# Patient Record
Sex: Female | Born: 2005 | Race: Black or African American | Hispanic: No | Marital: Single | Smoking: Never smoker
Health system: Southern US, Community
[De-identification: ages and names within clinical notes are randomized; demographics above are authoritative.]

## PROBLEM LIST (undated history)

## (undated) DIAGNOSIS — Z789 Other specified health status: Secondary | ICD-10-CM

## (undated) HISTORY — DX: Other specified health status: Z78.9

---
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
## 2024-03-22 NOTE — Telephone Encounter (Signed)
 Documents have been faxed to [facility] at [facility].

## 2024-03-26 NOTE — Telephone Encounter (Signed)
 Rx for zofran  has been sent.

## 2024-04-04 ENCOUNTER — Emergency Department (HOSPITAL_COMMUNITY)
Admission: EM | Admit: 2024-04-04 | Discharge: 2024-04-04 | Disposition: A | Discharge status: Home / Self Care | Attending: Emergency Medicine | Admitting: Emergency Medicine

## 2024-04-04 ENCOUNTER — Other Ambulatory Visit: Payer: Self-pay

## 2024-04-04 ENCOUNTER — Encounter (HOSPITAL_COMMUNITY): Payer: Self-pay | Admitting: Emergency Medicine

## 2024-04-04 ENCOUNTER — Emergency Department (HOSPITAL_COMMUNITY)

## 2024-04-04 DIAGNOSIS — Y92410 Unspecified street and highway as the place of occurrence of the external cause: Secondary | ICD-10-CM | POA: Insufficient documentation

## 2024-04-04 DIAGNOSIS — O26891 Other specified pregnancy related conditions, first trimester: Secondary | ICD-10-CM | POA: Insufficient documentation

## 2024-04-04 DIAGNOSIS — R109 Unspecified abdominal pain: Secondary | ICD-10-CM | POA: Diagnosis not present

## 2024-04-04 DIAGNOSIS — Z3A01 Less than 8 weeks gestation of pregnancy: Secondary | ICD-10-CM | POA: Diagnosis not present

## 2024-04-04 DIAGNOSIS — M549 Dorsalgia, unspecified: Secondary | ICD-10-CM | POA: Insufficient documentation

## 2024-04-04 LAB — CBC WITH DIFFERENTIAL/PLATELET
Abs Immature Granulocytes: 0.03 K/uL (ref 0.00–0.07)
Basophils Absolute: 0.1 K/uL (ref 0.0–0.1)
Basophils Relative: 1 %
Eosinophils Absolute: 0.2 K/uL (ref 0.0–0.5)
Eosinophils Relative: 2 %
HCT: 39.7 % (ref 36.0–46.0)
Hemoglobin: 12.6 g/dL (ref 12.0–15.0)
Immature Granulocytes: 0 %
Lymphocytes Relative: 26 %
Lymphs Abs: 3.1 K/uL (ref 0.7–4.0)
MCH: 28.1 pg (ref 26.0–34.0)
MCHC: 31.7 g/dL (ref 30.0–36.0)
MCV: 88.4 fL (ref 80.0–100.0)
Monocytes Absolute: 0.6 K/uL (ref 0.1–1.0)
Monocytes Relative: 5 %
Neutro Abs: 7.7 K/uL (ref 1.7–7.7)
Neutrophils Relative %: 66 %
Platelets: 406 K/uL — ABNORMAL HIGH (ref 150–400)
RBC: 4.49 MIL/uL (ref 3.87–5.11)
RDW: 13.6 % (ref 11.5–15.5)
WBC: 11.7 K/uL — ABNORMAL HIGH (ref 4.0–10.5)
nRBC: 0 % (ref 0.0–0.2)

## 2024-04-04 LAB — COMPREHENSIVE METABOLIC PANEL WITH GFR
ALT: <5 U/L (ref 0–44)
AST: 21 U/L (ref 15–41)
Albumin: 4.1 g/dL (ref 3.5–5.0)
Alkaline Phosphatase: 67 U/L (ref 38–126)
Anion gap: 10 (ref 5–15)
BUN: 9 mg/dL (ref 6–20)
CO2: 22 mmol/L (ref 22–32)
Calcium: 9.4 mg/dL (ref 8.9–10.3)
Chloride: 102 mmol/L (ref 98–111)
Creatinine, Ser: 0.62 mg/dL (ref 0.44–1.00)
GFR, Estimated: >60 mL/min (ref >60)
Glucose, Bld: 101 mg/dL — ABNORMAL HIGH (ref 70–99)
Potassium: 4 mmol/L (ref 3.5–5.1)
Sodium: 134 mmol/L — ABNORMAL LOW (ref 135–145)
Total Bilirubin: 0.3 mg/dL (ref 0.0–1.2)
Total Protein: 7.4 g/dL (ref 6.5–8.1)

## 2024-04-04 LAB — URINALYSIS, ROUTINE W REFLEX MICROSCOPIC
Bilirubin Urine: NEGATIVE
Glucose, UA: NEGATIVE mg/dL
Hgb urine dipstick: NEGATIVE
Ketones, ur: NEGATIVE mg/dL
Leukocytes,Ua: NEGATIVE
Nitrite: NEGATIVE
Protein, ur: NEGATIVE mg/dL
Specific Gravity, Urine: 1.02 (ref 1.005–1.030)
pH: 6 (ref 5.0–8.0)

## 2024-04-04 LAB — HCG, QUANTITATIVE, PREGNANCY: hCG, Beta Chain, Quant, S: 102057 m[IU]/mL — ABNORMAL HIGH (ref <5)
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
## 2024-04-04 NOTE — Discharge Instructions (Signed)
 Ultrasound was reassuring and demonstrated a fetus within the uterus with good heartbeat.  Take Tylenol for your pain.  Follow-up with OB.  Return if you have any concerning symptoms.

## 2024-04-13 ENCOUNTER — Telehealth (INDEPENDENT_AMBULATORY_CARE_PROVIDER_SITE_OTHER)

## 2024-04-13 DIAGNOSIS — Z3A09 9 weeks gestation of pregnancy: Secondary | ICD-10-CM

## 2024-04-13 DIAGNOSIS — Z3401 Encounter for supervision of normal first pregnancy, first trimester: Secondary | ICD-10-CM

## 2024-04-13 MED ORDER — BLOOD PRESSURE KIT DEVI: 1.0000 | 0 refills | Status: AC | PRN

## 2024-04-13 MED ORDER — GOJJI WEIGHT SCALE MISC: 1.0000 | 0 refills | Status: AC | PRN

## 2024-04-13 NOTE — Progress Notes (Signed)
 New OB Intake  I connected with Catherine Leach  on 04/13/24 at 10:15 AM EST by MyChart Video Visit and verified that I am speaking with the correct person using two identifiers. Nurse is located at [facility] and pt is located at home.  I discussed the limitations, risks, security and privacy concerns of performing an evaluation and management service by telephone and the availability of in person appointments. I also discussed with the patient that there may be a patient responsible charge related to this service. The patient expressed understanding and agreed to proceed.  I explained I am completing New OB Intake today. We discussed EDD of 11/15/2024 based on US  at 7 weeks. Pt is G1P0. I reviewed her allergies, medications and Medical/Surgical/OB history.    Patient Active Problem List   Diagnosis Date Noted   Encounter for supervision of normal first pregnancy in first trimester 04/13/2024   Acne vulgaris 02/14/2021   Eczema 02/14/2021   Seasonal allergies 02/14/2021     Concerns addressed today  Delivery Plans Plans to deliver at [facility]. Discussed the nature of our practice with multiple providers including residents and students as well as female and female providers. Due to the size of the practice, the delivering provider may not be the same as those providing prenatal care.   Patient unsure interested in water birth.  MyChart/Babyscripts MyChart access verified. I explained pt will have some visits in office and some virtually. Babyscripts instructions given and order placed. Patient verifies receipt of registration text/e-mail. Account successfully created and app downloaded. If patient is a candidate for Optimized scheduling, add to sticky note.   Blood Pressure Cuff/Weight Scale Blood pressure cuff ordered for patient to pick-up from [employer]. Explained after first prenatal appt pt will check weekly and document in Babyscripts. Patient does not have weight scale; order  sent to [facility], patient may track weight weekly in Babyscripts.  Anatomy US  Explained first scheduled US  will be around 19 weeks. Anatomy US  scheduled for 06/22/2024 at 800AM.  Is patient a CenteringPregnancy candidate?  Declined Declined due to Group setting   Is patient a Mom+Baby Combined Care candidate?  Unusre   If accepted, confirm patient does not intend to move from the area for at least 12 months, then notify Mom+Baby staff  Is patient a candidate for Babyscripts Optimization? Yes, patient declined   First visit review I reviewed new OB appt with patient. Explained pt will be seen by Burnard Moats MD at first visit. Discussed [place] genetic screening with patient. Yes Panorama and Horizon.. Routine prenatal labs is not   Last Pap Due to age, no pap needed.   Ermalinda DEEM, CMA 04/13/2024  10:23 AM

## 2024-04-20 ENCOUNTER — Ambulatory Visit (INDEPENDENT_AMBULATORY_CARE_PROVIDER_SITE_OTHER): Payer: Self-pay | Admitting: Obstetrics and Gynecology

## 2024-04-20 ENCOUNTER — Encounter: Payer: Self-pay | Admitting: Obstetrics and Gynecology

## 2024-04-20 ENCOUNTER — Other Ambulatory Visit (HOSPITAL_COMMUNITY)
Admission: RE | Admit: 2024-04-20 | Discharge: 2024-04-20 | Disposition: A | Discharge status: Home / Self Care | Source: Ambulatory Visit | Attending: Obstetrics and Gynecology | Admitting: Obstetrics and Gynecology

## 2024-04-20 VITALS — BP 119/75 | HR 101 | Wt 168.0 lb

## 2024-04-20 DIAGNOSIS — Z3491 Encounter for supervision of normal pregnancy, unspecified, first trimester: Secondary | ICD-10-CM | POA: Diagnosis not present

## 2024-04-20 DIAGNOSIS — Z3A10 10 weeks gestation of pregnancy: Secondary | ICD-10-CM

## 2024-04-20 DIAGNOSIS — Z31430 Encounter of female for testing for genetic disease carrier status for procreative management: Secondary | ICD-10-CM

## 2024-04-20 DIAGNOSIS — Z1332 Encounter for screening for maternal depression: Secondary | ICD-10-CM | POA: Diagnosis not present

## 2024-04-20 DIAGNOSIS — Z3401 Encounter for supervision of normal first pregnancy, first trimester: Secondary | ICD-10-CM | POA: Insufficient documentation

## 2024-04-20 DIAGNOSIS — Z3400 Encounter for supervision of normal first pregnancy, unspecified trimester: Secondary | ICD-10-CM | POA: Insufficient documentation

## 2024-04-20 DIAGNOSIS — N898 Other specified noninflammatory disorders of vagina: Secondary | ICD-10-CM | POA: Diagnosis not present

## 2024-04-20 DIAGNOSIS — Z3490 Encounter for supervision of normal pregnancy, unspecified, unspecified trimester: Secondary | ICD-10-CM

## 2024-04-20 MED ORDER — PREPLUS 27-1 MG PO TABS
1.0000 | ORAL_TABLET | Freq: Every day | ORAL | 13 refills | Status: AC
Start: 2024-04-20 — End: ?

## 2024-04-20 NOTE — Progress Notes (Signed)
 INITIAL PRENATAL VISIT NOTE  Subjective:  Catherine Leach is a 18 y.o. G1P0 at [redacted]w[redacted]d by 7 weeks US  being seen today for her initial prenatal visit. She has an obstetric history significant for n/a. She has a medical history significant for n/a.  Patient reports no complaints.   . Vag. Bleeding: None.   . Denies leaking of fluid.    Past Medical History:  Diagnosis Date   Medical history non-contributory     History reviewed. No pertinent surgical history.  OB History  Gravida Para Term Preterm AB Living  1       SAB IAB Ectopic Multiple Live Births          # Outcome Date GA Lbr Len/2nd Weight Sex Type Anes PTL Lv  1 Current             Social History   Socioeconomic History   Marital status: Single    Spouse name: Not on file   Number of children: Not on file   Years of education: Not on file   Highest education level: Not on file  Occupational History   Not on file  Tobacco Use   Smoking status: Never    Passive exposure: Never   Smokeless tobacco: Never  Vaping Use   Vaping status: Never Used  Substance and Sexual Activity   Alcohol use: Not Currently    Comment: Socially   Drug use: Not Currently    Frequency: 7.0 times per week    Types: Marijuana    Comment: but not recently   Sexual activity: Not Currently    Birth control/protection: None, Pill  Other Topics Concern   Not on file  Social History Narrative   Not on file   Social Drivers of Health   Financial Resource Strain: Not on file  Food Insecurity: Low Risk  (06/02/2023)   Received from [facility]   Hunger Vital Sign    Within the past 12 months, you worried that your food would run out before you got money to buy more: Never true    Within the past 12 months, the food you bought just didn't last and you didn't have money to get more. : Never true  Transportation Needs: No Transportation Needs (06/02/2023)   Received from [facility]    In the past 12 months,  has lack of reliable transportation kept you from medical appointments, meetings, work or from getting things needed for daily living? : No  Physical Activity: Not on file  Stress: Not on file  Social Connections: Not on file    Family History  Problem Relation Age of Onset   Healthy Mother    Healthy Father      Current Outpatient Medications:    EPINEPHrine 0.3 MG/0.3ML SOSY, Inject 1 each as directed once as needed., Disp: , Rfl:    ondansetron  (ZOFRAN ) 4 MG tablet, Take 4 mg by mouth every 8 (eight) hours as needed., Disp: , Rfl:    Prenatal Vit-Fe Fumarate-FA (PREPLUS) 27-1 MG TABS, Take 1 tablet by mouth daily., Disp: 30 tablet, Rfl: 13   acetaminophen (TYLENOL) 325 MG tablet, Take 650 mg by mouth every 6 (six) hours as needed., Disp: , Rfl:    Blood Pressure Monitoring (BLOOD PRESSURE KIT) DEVI, 1 Device by Does not apply route as needed., Disp: 1 each, Rfl: 0   Misc. Devices (GOJJI WEIGHT SCALE) MISC, 1 Device by Does not apply route as needed., Disp: 1 each,  Rfl: 0   triamcinolone  cream (KENALOG ) 0.1 %, Apply 1 application topically 2 (two) times daily., Disp: 30 g, Rfl: 0  Allergies  Allergen Reactions   Fish Allergy Anaphylaxis and Swelling    Has EpiPen    Review of Systems: Negative except for what is mentioned in HPI.  Objective:   Vitals:   04/20/24 0915 04/20/24 0927  BP: (!) 127/93 119/75  Pulse: 93 (!) 101  Weight: 168 lb (76.2 kg)     Fetal Status: Fetal Heart Rate (bpm): 174         Physical Exam: BP 119/75   Pulse (!) 101   Wt 168 lb (76.2 kg)   LMP 01/09/2024   BMI 28.84 kg/m  CONSTITUTIONAL: Well-developed, well-nourished female in no acute distress.  NEUROLOGIC: Alert and oriented to person, place, and time. Normal reflexes, muscle tone coordination. No cranial nerve deficit noted. PSYCHIATRIC: Normal mood and affect. Normal behavior. Normal judgment and thought content. SKIN: Skin is warm and dry. No rash noted. Not diaphoretic. No erythema.  No pallor. HENT:  Normocephalic, atraumatic, External right and left ear normal. Oropharynx is clear and moist EYES: Conjunctivae and EOM are normal. Pupils are equal, round, and reactive to light. No scleral icterus.  NECK: Normal range of motion, supple, no masses CARDIOVASCULAR: Normal heart rate noted, regular rhythm RESPIRATORY: Effort normal, no problems with respiration noted BREASTS: deferred ABDOMEN: Soft, nontender, nondistended, gravid. GU: normal appearing external female genitalia, nulliparous normal appearing cervix, thick white discharge in vagina, no lesions noted Bimanual: 12 weeks sized uterus, no adnexal tenderness or palpable lesions noted MUSCULOSKELETAL: Normal range of motion. EXT:  No edema and no tenderness.    Assessment and Plan:  Pregnancy: G1P0 at [redacted]w[redacted]d by 7 weeks US   1. Encounter for supervision of normal first pregnancy in first trimester (Primary) Reviewed [facility] for [employer] structure, multiple providers, fellows, medical students, virtual visits, MyChart.  - CBC/D/Plt+RPR+Rh+ABO+RubIgG... - Hemoglobin A1c - Culture, OB Urine - Cervicovaginal ancillary only( [place])  2. Supervision of normal first teen pregnancy in first trimester  3. Pregnancy - PANORAMA PRENATAL TEST  4. Encounter of female for testing for genetic disease carrier status for procreative management - HORIZON Basic Panel  5. Vaginal discharge Swab today   Preterm labor symptoms and general obstetric precautions including but not limited to vaginal bleeding, contractions, leaking of fluid and fetal movement were reviewed in detail with the patient.  Please refer to After Visit Summary for other counseling recommendations.   Return in about 1 month (around 05/20/2024) for low OB.  Burnard CHRISTELLA Moats 04/20/2024 12:55 PM

## 2024-04-21 ENCOUNTER — Ambulatory Visit: Payer: Self-pay | Admitting: Obstetrics and Gynecology

## 2024-04-21 LAB — CBC/D/PLT+RPR+RH+ABO+RUBIGG...
Antibody Screen: NEGATIVE
Basophils Absolute: 0.1 x10E3/uL (ref 0.0–0.2)
Basos: 1 %
EOS (ABSOLUTE): 0.1 x10E3/uL (ref 0.0–0.4)
Eos: 1 %
HCV Ab: NONREACTIVE
HIV Screen 4th Generation wRfx: NONREACTIVE
Hematocrit: 41.2 % (ref 34.0–46.6)
Hemoglobin: 13.3 g/dL (ref 11.1–15.9)
Hepatitis B Surface Ag: NEGATIVE
Immature Grans (Abs): 0 x10E3/uL (ref 0.0–0.1)
Immature Granulocytes: 0 %
Lymphocytes Absolute: 2 x10E3/uL (ref 0.7–3.1)
Lymphs: 21 %
MCH: 28.9 pg (ref 26.6–33.0)
MCHC: 32.3 g/dL (ref 31.5–35.7)
MCV: 89 fL (ref 79–97)
Monocytes Absolute: 0.6 x10E3/uL (ref 0.1–0.9)
Monocytes: 6 %
Neutrophils Absolute: 6.5 x10E3/uL (ref 1.4–7.0)
Neutrophils: 71 %
Platelets: 412 x10E3/uL (ref 150–450)
RBC: 4.61 x10E6/uL (ref 3.77–5.28)
RDW: 13.9 % (ref 11.7–15.4)
RPR Ser Ql: NONREACTIVE
Rh Factor: POSITIVE
Rubella Antibodies, IGG: 7.99 {index} (ref >0.99)
WBC: 9.2 x10E3/uL (ref 3.4–10.8)

## 2024-04-21 LAB — CERVICOVAGINAL ANCILLARY ONLY
Bacterial Vaginitis (gardnerella): NEGATIVE
Candida Glabrata: NEGATIVE
Candida Vaginitis: NEGATIVE
Chlamydia: NEGATIVE
Comment: NEGATIVE
Comment: NEGATIVE
Comment: NEGATIVE
Comment: NEGATIVE
Comment: NEGATIVE
Comment: NORMAL
Neisseria Gonorrhea: NEGATIVE
Trichomonas: NEGATIVE

## 2024-04-21 LAB — HCV INTERPRETATION

## 2024-04-21 LAB — HEMOGLOBIN A1C
Est. average glucose Bld gHb Est-mCnc: 105 mg/dL
Hgb A1c MFr Bld: 5.3 % (ref 4.8–5.6)

## 2024-04-22 LAB — URINE CULTURE, OB REFLEX

## 2024-04-22 LAB — CULTURE, OB URINE

## 2024-04-26 LAB — PANORAMA PRENATAL TEST FULL PANEL:PANORAMA TEST PLUS 5 ADDITIONAL MICRODELETIONS: FETAL FRACTION: 5.3

## 2024-05-04 LAB — HORIZON CUSTOM: REPORT SUMMARY: NEGATIVE

## 2024-05-18 ENCOUNTER — Encounter: Payer: Self-pay | Admitting: Obstetrics and Gynecology

## 2024-05-18 ENCOUNTER — Ambulatory Visit: Admitting: Obstetrics and Gynecology

## 2024-05-18 ENCOUNTER — Other Ambulatory Visit: Payer: Self-pay

## 2024-05-18 VITALS — BP 120/80 | HR 102 | Wt 169.3 lb

## 2024-05-18 DIAGNOSIS — Z3401 Encounter for supervision of normal first pregnancy, first trimester: Secondary | ICD-10-CM

## 2024-05-18 DIAGNOSIS — Z3A14 14 weeks gestation of pregnancy: Secondary | ICD-10-CM

## 2024-05-18 MED ORDER — ASPIRIN 81 MG PO TBEC: 81.0000 mg | DELAYED_RELEASE_TABLET | Freq: Every day | ORAL | 2 refills | Status: DC

## 2024-05-18 NOTE — Progress Notes (Signed)
 PRENATAL VISIT NOTE  Subjective:  Catherine Leach is a 18 y.o. G1P0 at [redacted]w[redacted]d being seen today for ongoing prenatal care.  She is currently monitored for the following issues for this low-risk pregnancy and has Acne vulgaris; Eczema; Seasonal allergies; Encounter for supervision of normal first pregnancy in first trimester; and Supervision of normal first teen pregnancy on their problem list.  Patient reports no complaints.  Contractions: Not present. Vag. Bleeding: None.  Movement: Absent. Denies leaking of fluid.   The following portions of the patient's history were reviewed and updated as appropriate: allergies, current medications, past family history, past medical history, past social history, past surgical history and problem list.   Objective:   Vitals:   05/18/24 1032  BP: 120/80  Pulse: (!) 102  Weight: 169 lb 4.8 oz (76.8 kg)    Fetal Status:  Fetal Heart Rate (bpm): 156   Movement: Absent    General: Alert, oriented and cooperative. Patient is in no acute distress.  Skin: Skin is warm and dry. No rash noted.   Cardiovascular: Normal heart rate noted  Respiratory: Normal respiratory effort, no problems with respiration noted  Abdomen: Soft, gravid, appropriate for gestational age.  Pain/Pressure: Absent     Pelvic: Cervical exam deferred        Extremities: Normal range of motion.  Edema: None  Mental Status: Normal mood and affect. Normal behavior. Normal judgment and thought content.      04/20/2024    1:15 PM  Depression screen PHQ 2/9  Decreased Interest 0  Down, Depressed, Hopeless 0  PHQ - 2 Score 0  Altered sleeping 0  Tired, decreased energy 2  Change in appetite 1  Feeling bad or failure about yourself  0  Trouble concentrating 0  Moving slowly or fidgety/restless 0  Suicidal thoughts 0  PHQ-9 Score 3        04/20/2024    1:15 PM  GAD 7 : Generalized Anxiety Score  Nervous, Anxious, on Edge 0  Control/stop worrying 0  Worry too much -  different things 0  Trouble relaxing 0  Restless 0  Easily annoyed or irritable 1  Afraid - awful might happen 0  Total GAD 7 Score 1    Assessment and Plan:  Pregnancy: G1P0 at [redacted]w[redacted]d  1. Encounter for supervision of normal first pregnancy in first trimester (Primary) Anatomy scheduled for 06/22/24 Start baby aspirin  Counseled regarding risks/benefits of flu vaccine, patient accepts vaccine.   2. Supervision of normal first teen pregnancy in first trimester  3. [redacted] weeks gestation of pregnancy   Preterm labor symptoms and general obstetric precautions including but not limited to vaginal bleeding, contractions, leaking of fluid and fetal movement were reviewed in detail with the patient. Please refer to After Visit Summary for other counseling recommendations.   Return in about 1 month (around 06/18/2024) for low OB.  Future Appointments  Date Time Provider Department Center  06/17/2024 10:55 AM Meghna Hagmann, Devon E, PA-C [facility]  06/22/2024  8:00 AM [facility] PROVIDER 1 [facility]  06/22/2024  8:30 AM [facility] US1 [facility]  07/15/2024 11:15 AM Zina Jerilynn LABOR, MD [facility]  08/17/2024  8:20 AM [facility]A LAB [facility]  08/17/2024  9:15 AM [facility]-GENERAL 2 [facility]  09/03/2024 11:15 AM [facility]GENERAL 2 [facility]  09/17/2024 11:15 AM [facility]-GENERAL 2 [facility]  10/01/2024 11:15 AM [facility]-GENERAL 2 [facility]  10/15/2024 11:15 AM WMC-GENERAL 2 [facility]  10/22/2024 11:15 AM WMC-GENERAL 2 [facility]  10/29/2024 11:15 AM WMC-GENERAL 2 [facility]  11/05/2024 11:15 AM W[facility]-GENERAL 2 [facility]  11/12/2024 11:15 AM W[facility]-GENERAL 2 [facility]    Burnard CHRISTELLA Moats, MD b

## 2024-05-18 NOTE — Addendum Note (Signed)
 Addended by: HONORE MILLMAN E on: 05/18/2024 11:14 AM   Modules accepted: Orders

## 2024-05-18 NOTE — Patient Instructions (Signed)

## 2024-06-17 ENCOUNTER — Other Ambulatory Visit: Payer: Self-pay

## 2024-06-17 ENCOUNTER — Ambulatory Visit: Admitting: Physician Assistant

## 2024-06-17 VITALS — BP 119/65 | HR 84 | Wt 173.9 lb

## 2024-06-17 DIAGNOSIS — Z3A18 18 weeks gestation of pregnancy: Secondary | ICD-10-CM

## 2024-06-17 DIAGNOSIS — Z3402 Encounter for supervision of normal first pregnancy, second trimester: Secondary | ICD-10-CM

## 2024-06-17 MED ORDER — ASPIRIN 81 MG PO TBEC: 81.0000 mg | DELAYED_RELEASE_TABLET | Freq: Every day | ORAL | 2 refills | Status: AC

## 2024-06-17 NOTE — Progress Notes (Signed)
 "  PRENATAL VISIT NOTE  Subjective:  Nayana Lenig is a 19 y.o. G1P0 at [redacted]w[redacted]d being seen today for ongoing prenatal care.  She is currently monitored for the following issues for this low-risk pregnancy and has Acne vulgaris; Eczema; Seasonal allergies; Encounter for supervision of normal first pregnancy in first trimester; and Supervision of normal first teen pregnancy on their problem list.  Patient reports no complaints.   . Vag. Bleeding: None.  Movement: Present. Denies leaking of fluid.   The following portions of the patient's history were reviewed and updated as appropriate: allergies, current medications, past family history, past medical history, past social history, past surgical history and problem list.   Objective:   Vitals:   06/17/24 1118  BP: 119/65  Pulse: 84  Weight: 173 lb 14.4 oz (78.9 kg)    Fetal Status:  Fetal Heart Rate (bpm): 142   Movement: Present    General: Alert, oriented and cooperative. Patient is in no acute distress.  Skin: Skin is warm and dry. No rash noted.   Cardiovascular: Normal heart rate noted  Respiratory: Normal respiratory effort, no problems with respiration noted  Abdomen: Soft, gravid, appropriate for gestational age.  Pain/Pressure: Absent     Pelvic: Cervical exam deferred        Extremities: Normal range of motion.     Mental Status: Normal mood and affect. Normal behavior. Normal judgment and thought content.      04/20/2024    1:15 PM  Depression screen PHQ 2/9  Decreased Interest 0  Down, Depressed, Hopeless 0  PHQ - 2 Score 0  Altered sleeping 0  Tired, decreased energy 2  Change in appetite 1  Feeling bad or failure about yourself  0  Trouble concentrating 0  Moving slowly or fidgety/restless 0  Suicidal thoughts 0  PHQ-9 Score 3        04/20/2024    1:15 PM  GAD 7 : Generalized Anxiety Score  Nervous, Anxious, on Edge 0  Control/stop worrying 0  Worry too much - different things 0  Trouble relaxing 0   Restless 0  Easily annoyed or irritable 1  Afraid - awful might happen 0  Total GAD 7 Score 1    Assessment and Plan:  Pregnancy: G1P0 at [redacted]w[redacted]d  1. Supervision of normal first teen pregnancy in second trimester (Primary) Patient doing well, feeling regular fetal movement BP, FHR appropriate   2. [redacted] weeks gestation of pregnancy Anticipatory guidance about next visits/weeks of pregnancy given.   Preterm labor symptoms and general obstetric precautions including but not limited to vaginal bleeding, contractions, leaking of fluid and fetal movement were reviewed in detail with the patient.  Please refer to After Visit Summary for other counseling recommendations.   Return in about 4 weeks (around 07/15/2024) for LOB.  Future Appointments  Date Time Provider Department Center  06/22/2024  8:00 AM [facility] PROVIDER 1 [facility]  06/22/2024  8:30 AM [facility] US1 [facility]  07/15/2024 11:15 AM Zina Jerilynn LABOR, MD [facility]  08/17/2024  8:20 AM [facility]WOCA LAB [facility]  08/17/2024  9:15 AM WMC-GENERAL 2 [facility]  09/03/2024 11:15 AM W[facility]-GENERAL 2 [facility]  09/17/2024 11:15 AM WMC-GENERAL 2 [facility]  10/01/2024 11:15 AM WMC-GENERAL 2 [facility]  10/15/2024 11:15 AM WMC-GENERAL 2 [facility]  10/22/2024 11:15 AM WMC-GENERAL 2 [facility]  10/29/2024 11:15 AM WMC-GENERAL 2 [facility]  11/05/2024 11:15 AM WMC-GENERAL 2 [facility]  11/12/2024 11:15 AM WMC-GENERAL 2  [facility]    Jorene FORBES Moats, PA-C  "

## 2024-06-18 LAB — AFP, SERUM, OPEN SPINA BIFIDA
AFP MoM: 1.54
AFP Value: 65.8 ng/mL
Gest. Age on Collection Date: 18 wk
Maternal Age At EDD: 20.2 a
OSBR Risk 1 IN: 4914
Test Results:: NEGATIVE
Weight: 174 [lb_av]

## 2024-06-22 ENCOUNTER — Ambulatory Visit (HOSPITAL_BASED_OUTPATIENT_CLINIC_OR_DEPARTMENT_OTHER)

## 2024-06-22 ENCOUNTER — Ambulatory Visit: Attending: Obstetrics and Gynecology | Admitting: Maternal & Fetal Medicine

## 2024-06-22 ENCOUNTER — Ambulatory Visit (HOSPITAL_COMMUNITY): Payer: Self-pay | Admitting: Physician Assistant

## 2024-06-22 ENCOUNTER — Other Ambulatory Visit: Payer: Self-pay | Admitting: Obstetrics and Gynecology

## 2024-06-22 VITALS — BP 123/66

## 2024-06-22 DIAGNOSIS — O358XX0 Maternal care for other (suspected) fetal abnormality and damage, not applicable or unspecified: Secondary | ICD-10-CM | POA: Insufficient documentation

## 2024-06-22 DIAGNOSIS — Z3A19 19 weeks gestation of pregnancy: Secondary | ICD-10-CM | POA: Insufficient documentation

## 2024-06-22 DIAGNOSIS — Z3401 Encounter for supervision of normal first pregnancy, first trimester: Secondary | ICD-10-CM

## 2024-06-22 DIAGNOSIS — Z3A09 9 weeks gestation of pregnancy: Secondary | ICD-10-CM

## 2024-06-22 NOTE — Progress Notes (Signed)
 "  Patient information  Patient Name: Catherine Leach  Patient MRN:   [id number]  Referring practice: MFM Referring Provider: [facility])  Problem List   Patient Active Problem List   Diagnosis Date Noted   Supervision of normal first teen pregnancy 04/20/2024   Encounter for supervision of normal first pregnancy in first trimester 04/13/2024   Acne vulgaris 02/14/2021   Eczema 02/14/2021   Seasonal allergies 02/14/2021    Maternal Fetal Medicine Consult Catherine Leach is a 19 y.o. G1P0 at [redacted]w[redacted]d here for ultrasound and consultation. Catherine Leach had low risk aneuploidy screening - Catherine Leach does not want to know gender but this is consistent today on US  vs NIPT. Carrier screening was Negative for the basic screening (SMA, alpha-thal, beta-thal, and cystic fibroisis. Maternal serum AFP n/a. Catherine Leach has no acute concerns.   The patient is here for a detailed anatomy ultrasound and is well dated by an early ultrasound. The anatomic survey performed today appears normal for gestational age. We discussed that future ultrasounds would be indicated only if clinical concerns arise, such as gestational diabetes, hypertensive disorders, fundal height measurements that are inconsistent with dates, or concern for fetal growth abnormalities. At this time, there are no indications for additional ultrasounds, and the patient will continue routine prenatal care with her primary OB provider.  Choroid plexus cyst (CPC) I discussed the finding of right sided choroid plexus cyst was noted in the lateral ventricle of the fetal brain. There are no other structural abnormalities and management depends on the presence of absence of aneuploidy screening.  The patient had low risk aneuploidy screening at her OB provider's office.  In this context, a CPC is considered a normal variant and no further workup is needed at this time.  The Society for maternal-fetal medicine does not recommend amniocentesis for  isolated choroid plexus cysts in the setting of low risk aneuploidy screening.  I reassured the patient that this should be interpreted as a normal variant and does not have any adverse impact on her fetus.  RECOMMENDATIONS -Continue routine prenatal care with primary OB provider -No additional ultrasounds indicated at this time -Obtain follow-up ultrasound only if new clinical indications arise (e.g., gestational diabetes, hypertensive disorder, fundal height/date discrepancy, or concern for fetal growth)  45 minutes of time was spent reviewing the patient's chart including labs, imaging and documentation.  At least 50% of this time was spent with direct patient care discussing the diagnosis, management and prognosis of her care.  Review of Systems: A review of systems was performed and was negative except per HPI   Past Obstetrical History:  OB History  Gravida Para Term Preterm AB Living  1       SAB IAB Ectopic Multiple Live Births          # Outcome Date GA Lbr Len/2nd Weight Sex Type Anes PTL Lv  1 Current              Past Medical History:  Past Medical History:  Diagnosis Date   Medical history non-contributory      Past Surgical History:   History reviewed. No pertinent surgical history.   Home Medications:   Medications Ordered Prior to Encounter[1]    Allergies:   Allergies[2]   Physical Exam:   Vitals:   06/22/24 0749  BP: 123/66   Sitting comfortably on the sonogram table Nonlabored breathing Normal rate and rhythm Abdomen is nontender  Thank you for the opportunity to  be involved with this patient's care. Please let us  know if we can be of any further assistance.   Delora Smaller MFM, [place]   06/22/2024  9:36 AM      [1]  Current Outpatient Medications on File Prior to Visit  Medication Sig Dispense Refill   acetaminophen (TYLENOL) 325 MG tablet Take 650 mg by mouth every 6 (six) hours as needed.     aspirin  EC 81 MG tablet Take 1 tablet  (81 mg total) by mouth at bedtime. Start taking when you are [redacted] weeks pregnant for rest of pregnancy for prevention of preeclampsia 300 tablet 2   Blood Pressure Monitoring (BLOOD PRESSURE KIT) DEVI 1 Device by Does not apply route as needed. 1 each 0   EPINEPHrine 0.3 MG/0.3ML SOSY Inject 1 each as directed once as needed.     Misc. Devices (GOJJI WEIGHT SCALE) MISC 1 Device by Does not apply route as needed. 1 each 0   ondansetron  (ZOFRAN ) 4 MG tablet Take 4 mg by mouth every 8 (eight) hours as needed.     Prenatal Vit-Fe Fumarate-FA (PREPLUS) 27-1 MG TABS Take 1 tablet by mouth daily. 30 tablet 13   triamcinolone  cream (KENALOG ) 0.1 % Apply 1 application topically 2 (two) times daily. 30 g 0   No current facility-administered medications on file prior to visit.  [2]  Allergies Allergen Reactions   Fish Allergy Anaphylaxis and Swelling    Has EpiPen   "

## 2024-07-15 ENCOUNTER — Ambulatory Visit: Admitting: Obstetrics and Gynecology

## 2024-07-15 ENCOUNTER — Other Ambulatory Visit: Payer: Self-pay

## 2024-07-15 VITALS — BP 129/81 | HR 108 | Wt 180.4 lb

## 2024-07-15 DIAGNOSIS — Z3402 Encounter for supervision of normal first pregnancy, second trimester: Secondary | ICD-10-CM

## 2024-07-15 DIAGNOSIS — Z3A22 22 weeks gestation of pregnancy: Secondary | ICD-10-CM

## 2024-07-15 NOTE — Progress Notes (Signed)
" ° °  PRENATAL VISIT NOTE  Subjective:  Catherine Leach is a 19 y.o. G1P0 at [redacted]w[redacted]d being seen today for ongoing prenatal care.  She is currently monitored for the following issues for this low-risk pregnancy and has Acne vulgaris; Eczema; Seasonal allergies; Encounter for supervision of normal first pregnancy in first trimester; and Supervision of normal first teen pregnancy on their problem list.  Patient doing well with no acute concerns today. She reports no complaints.   . Vag. Bleeding: None.  Movement: Present. Denies leaking of fluid.   The following portions of the patient's history were reviewed and updated as appropriate: allergies, current medications, past family history, past medical history, past social history, past surgical history and problem list. Problem list updated.  Objective:   Vitals:   07/15/24 1120  BP: 129/81  Pulse: (!) 108  Weight: 180 lb 6.4 oz (81.8 kg)    Fetal Status: Fetal Heart Rate (bpm): 156 Fundal Height: 21 cm Movement: Present     General:  Alert, oriented and cooperative. Patient is in no acute distress.  Skin: Skin is warm and dry. No rash noted.   Cardiovascular: Normal heart rate noted  Respiratory: Normal respiratory effort, no problems with respiration noted  Abdomen: Soft, gravid, appropriate for gestational age.  Pain/Pressure: Absent     Pelvic: Cervical exam deferred        Extremities: Normal range of motion.     Mental Status:  Normal mood and affect. Normal behavior. Normal judgment and thought content.   Assessment and Plan:  Pregnancy: G1P0 at [redacted]w[redacted]d  1. [redacted] weeks gestation of pregnancy (Primary)   2. Supervision of normal first teen pregnancy in second trimester Continue routine prenatal care  Preterm labor symptoms and general obstetric precautions including but not limited to vaginal bleeding, contractions, leaking of fluid and fetal movement were reviewed in detail with the patient.  Please refer to After Visit Summary  for other counseling recommendations.   Return in about 4 weeks (around 08/12/2024) for ROB, in person.   Jerilynn Buddle, MD Faculty Attending Center for [facility]   "

## 2024-08-17 ENCOUNTER — Other Ambulatory Visit

## 2024-08-17 ENCOUNTER — Encounter: Admitting: Certified Nurse Midwife

## 2024-09-03 ENCOUNTER — Encounter: Admitting: Family Medicine

## 2024-09-17 ENCOUNTER — Encounter: Admitting: Family Medicine

## 2024-10-01 ENCOUNTER — Encounter

## 2024-10-15 ENCOUNTER — Encounter

## 2024-10-22 ENCOUNTER — Encounter

## 2024-10-29 ENCOUNTER — Encounter

## 2024-11-05 ENCOUNTER — Encounter

## 2024-11-12 ENCOUNTER — Encounter
